# Patient Record
Sex: Female | Born: 1970 | Hispanic: No | Marital: Married | Smoking: Never smoker
Health system: Southern US, Community
[De-identification: ages and names within clinical notes are randomized; demographics above are authoritative.]

## PROBLEM LIST (undated history)

## (undated) HISTORY — PX: WRIST SURGERY: SHX841

---

## 2002-04-29 ENCOUNTER — Other Ambulatory Visit: Admission: RE | Admit: 2002-04-29 | Discharge: 2002-04-29 | Payer: Self-pay | Admitting: *Deleted

## 2002-07-16 ENCOUNTER — Inpatient Hospital Stay (HOSPITAL_COMMUNITY): Admission: AD | Admit: 2002-07-16 | Discharge: 2002-07-18 | Payer: Self-pay | Admitting: *Deleted

## 2008-06-10 ENCOUNTER — Ambulatory Visit: Payer: Self-pay | Admitting: Nurse Practitioner

## 2008-06-10 DIAGNOSIS — H0260 Xanthelasma of unspecified eye, unspecified eyelid: Secondary | ICD-10-CM | POA: Insufficient documentation

## 2008-06-11 ENCOUNTER — Ambulatory Visit: Payer: Self-pay | Admitting: Nurse Practitioner

## 2008-06-11 LAB — CONVERTED CEMR LAB
Albumin: 4.2 g/dL (ref 3.5–5.2)
BUN: 9 mg/dL (ref 6–23)
CO2: 21 meq/L (ref 19–32)
Calcium: 8.5 mg/dL (ref 8.4–10.5)
Chloride: 104 meq/L (ref 96–112)
Cholesterol: 207 mg/dL — ABNORMAL HIGH (ref 0–200)
Eosinophils Absolute: 0.1 10*3/uL (ref 0.0–0.7)
Glucose, Bld: 81 mg/dL (ref 70–99)
HDL: 56 mg/dL (ref >39)
Lymphocytes Relative: 30 % (ref 12–46)
Lymphs Abs: 2.1 10*3/uL (ref 0.7–4.0)
MCV: 85.7 fL (ref 78.0–100.0)
Monocytes Relative: 6 % (ref 3–12)
Neutro Abs: 4.4 10*3/uL (ref 1.7–7.7)
Neutrophils Relative %: 63 % (ref 43–77)
Potassium: 3.6 meq/L (ref 3.5–5.3)
RBC: 4.84 M/uL (ref 3.87–5.11)
Triglycerides: 117 mg/dL (ref <150)

## 2008-08-05 ENCOUNTER — Ambulatory Visit: Payer: Self-pay | Admitting: Nurse Practitioner

## 2008-08-05 ENCOUNTER — Other Ambulatory Visit: Admission: RE | Admit: 2008-08-05 | Discharge: 2008-08-05 | Payer: Self-pay | Admitting: Internal Medicine

## 2008-08-05 LAB — CONVERTED CEMR LAB
Blood in Urine, dipstick: NEGATIVE
GC Probe Amp, Genital: NEGATIVE
Glucose, Urine, Semiquant: NEGATIVE
Ketones, urine, test strip: NEGATIVE
OCCULT 1: NEGATIVE

## 2008-09-16 ENCOUNTER — Ambulatory Visit: Payer: Self-pay | Admitting: Nurse Practitioner

## 2008-09-20 ENCOUNTER — Encounter (INDEPENDENT_AMBULATORY_CARE_PROVIDER_SITE_OTHER): Payer: Self-pay | Admitting: Nurse Practitioner

## 2009-06-22 ENCOUNTER — Ambulatory Visit: Payer: Self-pay | Admitting: Family Medicine

## 2009-06-22 DIAGNOSIS — N390 Urinary tract infection, site not specified: Secondary | ICD-10-CM | POA: Insufficient documentation

## 2009-06-22 LAB — CONVERTED CEMR LAB
Blood in Urine, dipstick: NEGATIVE
Nitrite: NEGATIVE
Specific Gravity, Urine: <1.005
Urobilinogen, UA: 0.2
WBC Urine, dipstick: NEGATIVE

## 2009-06-28 ENCOUNTER — Ambulatory Visit: Payer: Self-pay | Admitting: *Deleted

## 2010-12-31 LAB — CONVERTED CEMR LAB
AST: 14 units/L (ref 0–37)
Albumin: 4.2 g/dL (ref 3.5–5.2)
Alkaline Phosphatase: 56 units/L (ref 39–117)
HDL: 58 mg/dL (ref >39)
Indirect Bilirubin: 0.3 mg/dL (ref 0.0–0.9)
Total Bilirubin: 0.4 mg/dL (ref 0.3–1.2)
Total Protein: 6.7 g/dL (ref 6.0–8.3)
Triglycerides: 108 mg/dL (ref <150)

## 2011-05-18 ENCOUNTER — Emergency Department (HOSPITAL_COMMUNITY)
Admission: EM | Admit: 2011-05-18 | Discharge: 2011-05-18 | Disposition: A | Discharge status: Home / Self Care | Payer: Self-pay | Attending: Emergency Medicine | Admitting: Emergency Medicine

## 2011-05-18 ENCOUNTER — Emergency Department (HOSPITAL_COMMUNITY): Payer: Self-pay

## 2011-05-18 DIAGNOSIS — S93409A Sprain of unspecified ligament of unspecified ankle, initial encounter: Secondary | ICD-10-CM | POA: Insufficient documentation

## 2011-05-18 DIAGNOSIS — M25559 Pain in unspecified hip: Secondary | ICD-10-CM | POA: Insufficient documentation

## 2011-05-18 DIAGNOSIS — M25579 Pain in unspecified ankle and joints of unspecified foot: Secondary | ICD-10-CM | POA: Insufficient documentation

## 2011-05-18 DIAGNOSIS — IMO0002 Reserved for concepts with insufficient information to code with codable children: Secondary | ICD-10-CM | POA: Insufficient documentation

## 2018-05-21 ENCOUNTER — Emergency Department (HOSPITAL_COMMUNITY)
Admission: EM | Admit: 2018-05-21 | Discharge: 2018-05-21 | Disposition: A | Discharge status: Home / Self Care | Attending: Emergency Medicine | Admitting: Emergency Medicine

## 2018-05-21 ENCOUNTER — Encounter (HOSPITAL_COMMUNITY): Payer: Self-pay | Admitting: *Deleted

## 2018-05-21 ENCOUNTER — Other Ambulatory Visit: Payer: Self-pay

## 2018-05-21 ENCOUNTER — Emergency Department (HOSPITAL_COMMUNITY)

## 2018-05-21 DIAGNOSIS — X58XXXA Exposure to other specified factors, initial encounter: Secondary | ICD-10-CM | POA: Diagnosis not present

## 2018-05-21 DIAGNOSIS — Y939 Activity, unspecified: Secondary | ICD-10-CM | POA: Diagnosis not present

## 2018-05-21 DIAGNOSIS — Y929 Unspecified place or not applicable: Secondary | ICD-10-CM | POA: Diagnosis not present

## 2018-05-21 DIAGNOSIS — S52531A Colles' fracture of right radius, initial encounter for closed fracture: Secondary | ICD-10-CM | POA: Diagnosis present

## 2018-05-21 DIAGNOSIS — Y990 Civilian activity done for income or pay: Secondary | ICD-10-CM | POA: Insufficient documentation

## 2018-05-21 MED ORDER — HYDROCODONE-ACETAMINOPHEN 5-325 MG PO TABS
1.0000 | ORAL_TABLET | Freq: Once | ORAL | Status: AC
  Administered 2018-05-21: 1 via ORAL
  Filled 2018-05-21: qty 1

## 2018-05-21 MED ORDER — HYDROCODONE-ACETAMINOPHEN 5-325 MG PO TABS: 1.0000 | ORAL_TABLET | Freq: Four times a day (QID) | ORAL | 0 refills | Status: DC | PRN

## 2018-05-21 MED ORDER — IBUPROFEN 400 MG PO TABS
400.0000 mg | ORAL_TABLET | Freq: Once | ORAL | Status: AC
  Administered 2018-05-21: 400 mg via ORAL
  Filled 2018-05-21: qty 1

## 2018-05-21 MED ORDER — IBUPROFEN 400 MG PO TABS: 400.0000 mg | ORAL_TABLET | Freq: Three times a day (TID) | ORAL | 0 refills | Status: DC | PRN

## 2018-05-21 NOTE — ED Triage Notes (Addendum)
To ED for eval of right forearm pain and swelling since slipping and falling at work. Minimal movement of fingers to right arm due to pain. Right radial pulses palpable. Pt holding arm in place of comfort. Ice pack given

## 2018-05-21 NOTE — ED Provider Notes (Signed)
[facility] Provider Note   CSN: [id number] Arrival date & time: 05/21/18  0012     History   Chief Complaint Chief Complaint  Patient presents with  . Arm Injury  . workers comp    HPI Cassandra Barton is a 47 y.o. female.  The history is provided by the patient and a relative. A language interpreter was used [phone]).  Arm Injury   This is a new problem. Episode onset: prior to arrival at work. The problem occurs constantly. The problem has been gradually worsening. The pain is present in the right wrist. The quality of the pain is described as aching. The pain is moderate. Associated symptoms include limited range of motion. Pertinent negatives include no numbness. She has tried rest for the symptoms. The treatment provided no relief. There has been a history of trauma.     Pt reports fall at work.  She reports coming off a ladder, slipped and fell landing on right wrist She has pain in right wrist only No other complaints Denies head injury   Patient Active Problem List   Diagnosis Date Noted  . UTI 06/22/2009  . 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 No pertinent surgical history.   OB History   None      Home Medications    Prior to Admission medications   Not on File    Family History No family history on file.  Social History Social History   Tobacco Use  . Smoking status: Not on file  Substance Use Topics  . Alcohol use: Not on file  . Drug use: Not on file     Allergies   Patient has no known allergies.   Review of Systems Review of Systems  Musculoskeletal: Positive for arthralgias and joint swelling. Negative for neck pain.  Neurological: Negative for numbness and headaches.  All other systems reviewed and are negative.    Physical Exam Updated Vital Signs BP (!) 147/98 (BP Location: Left Arm)   Pulse 83   Temp 99.2 F (37.3 C) (Oral)   Resp 14   SpO2 98%    Physical Exam CONSTITUTIONAL: Well developed/well nourished HEAD: Normocephalic/atraumatic EYES: EOMI ENMT: Mucous membranes moist NECK: supple no meningeal signs SPINE/BACK:entire spine nontender CV: S1/S2 noted, no murmurs/rubs/gallops noted LUNGS: Lungs are clear to auscultation bilaterally, no apparent distress ABDOMEN: soft NEURO: Pt is awake/alert/appropriate, moves all extremitiesx4.  No facial droop.  Able to move all fingers on right hand without difficulty EXTREMITIES: pulses normal/equal, full ROM, right distal radial pulses intact.  Mild soft tissue swelling to right wrist.  Tenderness to right wrist Full range of motion of right shoulder and right elbow without difficulty. SKIN: warm, color normal PSYCH: no abnormalities of mood noted, alert and oriented to situation   ED Treatments / Results  Labs (all labs ordered are listed, but only abnormal results are displayed) Labs Reviewed - No data to display  EKG None  Radiology Dg Forearm Right  Result Date: 05/21/2018 CLINICAL DATA:  Fall. EXAM: RIGHT FOREARM - 2 VIEW; RIGHT WRIST - COMPLETE 3+ VIEW COMPARISON:  None. FINDINGS: Two views of the right forearm and 4 views of the right wrist are obtained. There is a mostly transverse fracture of the distal right radial metaphysis with fracture lines extending to the radiocarpal and radioulnar joint. There is impaction and dorsal angulation of the distal fracture fragments. No ulnar styloid process fracture. Soft tissue swelling over the wrist and  distal forearm. Carpal bones appear intact. Proximal radius and ulna appear intact. No additional fractures identified. No focal bone lesion or bone destruction. IMPRESSION: Mostly transverse fractures of the distal right radial metaphysis with extension to the radiocarpal and radioulnar joint. No or styloid process fracture. Electronically Signed   By: Burman Nieves M.D.   On: 05/21/2018 01:21   Dg Wrist Complete Right  Result  Date: 05/21/2018 CLINICAL DATA:  Fall. EXAM: RIGHT FOREARM - 2 VIEW; RIGHT WRIST - COMPLETE 3+ VIEW COMPARISON:  None. FINDINGS: Two views of the right forearm and 4 views of the right wrist are obtained. There is a mostly transverse fracture of the distal right radial metaphysis with fracture lines extending to the radiocarpal and radioulnar joint. There is impaction and dorsal angulation of the distal fracture fragments. No ulnar styloid process fracture. Soft tissue swelling over the wrist and distal forearm. Carpal bones appear intact. Proximal radius and ulna appear intact. No additional fractures identified. No focal bone lesion or bone destruction. IMPRESSION: Mostly transverse fractures of the distal right radial metaphysis with extension to the radiocarpal and radioulnar joint. No or styloid process fracture. Electronically Signed   By: Burman Nieves M.D.   On: 05/21/2018 01:21    Procedures Procedures   SPLINT APPLICATION Date/Time: 3:11 AM Authorized by: Joya Gaskins Consent: Verbal consent obtained. Risks and benefits: risks, benefits and alternatives were discussed Consent given by: patient Splint applied by: orthopedic technician Location details: right arm Splint type: sugartong Supplies used: ortho glass Post-procedure: The splinted body part was neurovascularly unchanged following the procedure. Patient tolerance: Patient tolerated the procedure well with no immediate complications.     Medications Ordered in ED Medications  HYDROcodone-acetaminophen (NORCO/VICODIN) 5-325 MG per tablet 1 tablet (1 tablet Oral Given 05/21/18 0319)  ibuprofen (ADVIL,MOTRIN) tablet 400 mg (400 mg Oral Given 05/21/18 0319)     Initial Impression / Assessment and Plan / ED Course  I have reviewed the triage vital signs and the nursing notes.  Pertinent  imaging results that were available during my care of the patient were reviewed by me and considered in my medical decision making  (see chart for details). Narcotic database reviewed and considered in decision making    Patient with accidental fall at work.  She sustained a distal right radius fracture.  This is a closed injury, mild displacement, but does not require closed reduction.  She tolerated splint placement well.  I have instructed her to consult/call hand surgery later this morning.  I discussed appropriate care of splint, we discussed use of pain medications.  Daughter at bedside to assist patient.  Final Clinical Impressions(s) / ED Diagnoses   Final diagnoses:  Closed Colles' fracture of right radius, initial encounter    ED Discharge Orders        Ordered    ibuprofen (ADVIL,MOTRIN) 400 MG tablet  Every 8 hours PRN     05/21/18 0417    HYDROcodone-acetaminophen (NORCO/VICODIN) 5-325 MG tablet  Every 6 hours PRN     05/21/18 0417       Zadie Rhine, MD 05/21/18 [phone]

## 2018-08-14 ENCOUNTER — Ambulatory Visit: Payer: Self-pay | Admitting: Family Medicine

## 2019-09-24 ENCOUNTER — Other Ambulatory Visit: Payer: Self-pay

## 2019-09-24 DIAGNOSIS — Z20822 Contact with and (suspected) exposure to covid-19: Secondary | ICD-10-CM

## 2019-09-26 LAB — NOVEL CORONAVIRUS, NAA: SARS-CoV-2, NAA: DETECTED — AB

## 2019-10-01 ENCOUNTER — Telehealth: Payer: Self-pay | Admitting: *Deleted

## 2019-10-01 NOTE — Telephone Encounter (Signed)
See Results note for today regarding TE for positive Covid19 results.

## 2019-10-09 ENCOUNTER — Other Ambulatory Visit: Payer: Self-pay

## 2019-10-09 DIAGNOSIS — Z20822 Contact with and (suspected) exposure to covid-19: Secondary | ICD-10-CM

## 2019-10-10 LAB — NOVEL CORONAVIRUS, NAA: SARS-CoV-2, NAA: DETECTED — AB

## 2020-08-01 ENCOUNTER — Other Ambulatory Visit: Payer: Self-pay | Admitting: Obstetrics and Gynecology

## 2020-08-01 ENCOUNTER — Other Ambulatory Visit: Payer: Self-pay | Admitting: Family Medicine

## 2020-08-01 DIAGNOSIS — Z1231 Encounter for screening mammogram for malignant neoplasm of breast: Secondary | ICD-10-CM

## 2020-09-06 ENCOUNTER — Ambulatory Visit: Payer: Self-pay

## 2020-09-06 ENCOUNTER — Other Ambulatory Visit: Payer: Self-pay

## 2020-10-11 ENCOUNTER — Ambulatory Visit
Admission: RE | Admit: 2020-10-11 | Discharge: 2020-10-11 | Disposition: A | Discharge status: Home / Self Care | Payer: Self-pay | Source: Ambulatory Visit | Attending: Family Medicine | Admitting: Family Medicine

## 2020-10-11 ENCOUNTER — Ambulatory Visit: Payer: Self-pay | Admitting: *Deleted

## 2020-10-11 ENCOUNTER — Other Ambulatory Visit: Payer: Self-pay

## 2020-10-11 VITALS — BP 140/86 | Temp 97.5°F | Wt 140.5 lb

## 2020-10-11 DIAGNOSIS — Z1231 Encounter for screening mammogram for malignant neoplasm of breast: Secondary | ICD-10-CM

## 2020-10-11 DIAGNOSIS — Z01419 Encounter for gynecological examination (general) (routine) without abnormal findings: Secondary | ICD-10-CM

## 2020-10-11 NOTE — Addendum Note (Signed)
Addended by: Narda Rutherford on: 10/11/2020 10:05 AM   Modules accepted: Orders

## 2020-10-11 NOTE — Patient Instructions (Signed)
Explained breast self awareness with Cassandra Barton. Pap smear completed today. Let her know BCCCP will cover Pap smears and HPV typing every 5 years unless has a history of abnormal Pap smears. Referred patient to the [facility] for a screening mammogram. Appointment scheduled Tuesday, October 11, 2020 at 1030. Patient aware of appointment and will be there. Let patient know will follow up with her within the next couple weeks with results of Pap smear by letter or phone. Informed patient that the Breast Center will follow-up with her within the next couple of weeks with results of mammogram by letter or phone. Cassandra Barton verbalized understanding.  Cassandra Barton, Kathaleen Maser, RN 9:00 AM

## 2020-10-11 NOTE — Progress Notes (Signed)
Cassandra Barton is a 49 y.o. No obstetric history on file. female who presents to [facility] clinic today with no complaints.    Pap Smear: Pap smear completed today. Last Pap smear was over 5 years ago and was normal per patient. Per patient has history of an abnormal Pap smear around 20 years ago that a repeat Pap smear was completed for follow-up. Per patient her follow-up Pap smear was normal and all Pap smears have been normal since. Patient stated she has had at least three normal Pap smears since abnormal. Last Pap smear result is not available in Epic.   Physical exam: Breasts Breasts symmetrical. No skin abnormalities bilateral breasts. No nipple retraction bilateral breasts. No nipple discharge bilateral breasts. No lymphadenopathy. No lumps palpated bilateral breasts. No complaints of pain or tenderness on exam.       Pelvic/Bimanual Ext Genitalia No lesions, no swelling and no discharge observed on external genitalia.        Vagina Vagina pink and normal texture. No lesions or discharge observed in vagina.        Cervix Cervix is present. Cervix pink and of normal texture. Cervix friable. No discharge observed.    Uterus Uterus is present and palpable. Uterus in normal position and normal size.        Adnexae Bilateral ovaries present and palpable. No tenderness on palpation.         Rectovaginal No rectal exam completed today since patient had no rectal complaints. No skin abnormalities observed on exam.     Smoking History: Patient has never smoked.   Patient Navigation: Patient education provided. Access to services provided for patient through BCCCP program. Daughter Cassandra Barton signed required paperwork to interpret for patient.  Colorectal Cancer Screening: Per patient has never had colonoscopy completed. FIT Test given to patient to complete. No complaints today.    Breast and Cervical Cancer Risk Assessment: Patient does not have family history of  breast cancer, known genetic mutations, or radiation treatment to the chest before age 58. Patient does not have history of cervical dysplasia, immunocompromised, or DES exposure in-utero.  Risk Assessment    Risk Scores      10/11/2020   Last edited by: Narda Rutherford, LPN   5-year risk: 0.4 %   Lifetime risk: 4.3 %          A: BCCCP exam with pap smear No complaints.  P: Referred patient to the [facility] for a screening mammogram. Appointment scheduled Tuesday, October 11, 2020 at 1030.  Priscille Heidelberg, RN 10/11/2020 9:00 AM

## 2020-10-17 LAB — CYTOLOGY - PAP
Comment: NEGATIVE
Comment: NEGATIVE
HPV 16: NEGATIVE
HPV 18 / 45: NEGATIVE
High risk HPV: POSITIVE — AB

## 2020-10-21 ENCOUNTER — Telehealth: Payer: Self-pay

## 2020-10-21 NOTE — Telephone Encounter (Signed)
Via Delorise Royals, Spanish Interpreter, Patient informed Pap results LSIL with Positive HPV, per Dr. Jolayne Panther, needs colposcopy, will contact [facility] to schedule colposcopy. Patient verbalized understanding.

## 2020-12-01 ENCOUNTER — Ambulatory Visit (INDEPENDENT_AMBULATORY_CARE_PROVIDER_SITE_OTHER): Payer: Self-pay | Admitting: Family Medicine

## 2020-12-01 ENCOUNTER — Other Ambulatory Visit: Payer: Self-pay

## 2020-12-01 ENCOUNTER — Other Ambulatory Visit (HOSPITAL_COMMUNITY)
Admission: RE | Admit: 2020-12-01 | Discharge: 2020-12-01 | Disposition: A | Discharge status: Home / Self Care | Payer: Self-pay | Source: Ambulatory Visit | Attending: Family Medicine | Admitting: Family Medicine

## 2020-12-01 ENCOUNTER — Encounter: Payer: Self-pay | Admitting: Family Medicine

## 2020-12-01 VITALS — BP 164/87 | HR 64 | Wt 135.4 lb

## 2020-12-01 DIAGNOSIS — R87612 Low grade squamous intraepithelial lesion on cytologic smear of cervix (LGSIL): Secondary | ICD-10-CM

## 2020-12-01 DIAGNOSIS — Z3202 Encounter for pregnancy test, result negative: Secondary | ICD-10-CM

## 2020-12-01 LAB — POCT PREGNANCY, URINE: Preg Test, Ur: NEGATIVE

## 2020-12-01 NOTE — Progress Notes (Signed)
° ° °  GYNECOLOGY CLINIC COLPOSCOPY PROCEDURE NOTE  49 y.o. No obstetric history on file. here for colposcopy for LSIL with positive HRHPV pap smear on 10/11/2020. Discussed role for HPV in cervical dysplasia, need for surveillance.  Patient given informed consent, signed copy in the chart, time out was performed.  Placed in lithotomy position. Cervix viewed with speculum and colposcope after application of acetic acid.   Colposcopy adequate? No. Exceedingly difficult colposcopy due to post-menopausal cervix flush with posterior vaginal wall, great difficulty getting ECC into the os.   acetowhite lesion(s) noted at 9 o'clock; corresponding biopsies obtained.  ECC specimen obtained. All specimens were labeled and sent to pathology.  Patient was given post procedure instructions.  Will follow up pathology and manage accordingly; patient will be contacted with results and recommendations.  Routine preventative health maintenance measures emphasized.  May need repeat procedure pending outcome of pathology.   Venora Maples, MD/[facility]

## 2020-12-05 LAB — SURGICAL PATHOLOGY

## 2020-12-06 ENCOUNTER — Telehealth: Payer: Self-pay | Admitting: Family Medicine

## 2020-12-06 NOTE — Telephone Encounter (Signed)
Attempted to call patient regarding colpo results (CIN-I), reached her daughter who reported she is at work but will be home around 4 PM. Will call back at that time.

## 2020-12-06 NOTE — Telephone Encounter (Signed)
Called patient and confirmed identity, discussed CIN-I results and need for repeat colpo in 1 year. All questions answered.

## 2021-07-09 ENCOUNTER — Ambulatory Visit (INDEPENDENT_AMBULATORY_CARE_PROVIDER_SITE_OTHER)

## 2021-07-09 ENCOUNTER — Ambulatory Visit (HOSPITAL_COMMUNITY)
Admission: EM | Admit: 2021-07-09 | Discharge: 2021-07-09 | Disposition: A | Discharge status: Home / Self Care | Attending: Emergency Medicine | Admitting: Emergency Medicine

## 2021-07-09 ENCOUNTER — Other Ambulatory Visit: Payer: Self-pay

## 2021-07-09 ENCOUNTER — Encounter (HOSPITAL_COMMUNITY): Payer: Self-pay

## 2021-07-09 DIAGNOSIS — S52615A Nondisplaced fracture of left ulna styloid process, initial encounter for closed fracture: Secondary | ICD-10-CM

## 2021-07-09 DIAGNOSIS — M25532 Pain in left wrist: Secondary | ICD-10-CM | POA: Diagnosis not present

## 2021-07-09 DIAGNOSIS — S52572A Other intraarticular fracture of lower end of left radius, initial encounter for closed fracture: Secondary | ICD-10-CM

## 2021-07-09 MED ORDER — HYDROCODONE-ACETAMINOPHEN 5-325 MG PO TABS: 1.0000 | ORAL_TABLET | Freq: Four times a day (QID) | ORAL | 0 refills | Status: AC | PRN

## 2021-07-09 NOTE — ED Provider Notes (Signed)
HPI  SUBJECTIVE:  Cassandra Barton is a right-handed 50 y.o. female who presents with a slip and fall on a wet floor, falling onto an outstretched left hand.  This occurred immediately prior to arrival while at work.  She reports swelling, tingling in her fingers, limitation of motion in her wrist.  No numbness, bruising, limitation motion of her fingers.  She states her forearm and hand are without injury.  She tried an unknown medication with improvement in her symptoms.  Symptoms are worse with palpation, movement.  She has no past medical history.  She has never injured this wrist before.  PMD: None.  Orthopedics: She has seen Dr. Amanda Pea at [facility] for right wrist fracture.  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 No pertinent past medical history.  Past Surgical History:  Procedure Laterality Date   WRIST SURGERY      History reviewed. No pertinent family history.  Social History   Tobacco Use   Smoking status: Never   Smokeless tobacco: Never  Vaping Use   Vaping Use: Never used  Substance Use Topics   Alcohol use: Yes    Comment: occasionally   Drug use: Never    No current facility-administered medications for this encounter.  Current Outpatient Medications:    HYDROcodone-acetaminophen (NORCO/VICODIN) 5-325 MG tablet, Take 1-2 tablets by mouth every 6 (six) hours as needed for moderate pain or severe pain., Disp: 12 tablet, Rfl: 0   Multiple Vitamin (MULTIVITAMIN) tablet, Take 1 tablet by mouth daily., Disp: , Rfl:   No Known Allergies   ROS  As noted in HPI.   Physical Exam   Constitutional: Well developed, well nourished, no acute distress Eyes:  EOMI, conjunctiva normal bilaterally HENT: Normocephalic, atraumatic,mucus membranes moist Respiratory: Normal inspiratory effort Cardiovascular: Normal rate GI: nondistended skin: No rash, skin intact Musculoskeletal: Tender swelling left wrist.  No bruising.  distal radius tender,  distal ulnar styloid ender, snuffbox tender, carpals NT, metacarpals NT , digits NT, TFCC tender,  pain with supination,  pain  with pronation, pain with radial / ulnar deviation. Motor intact ability to flex / extend digits of affected hand, Sensation LT to hand normal.  RP 2+.  Proximal forearm nontender.  Neurologic: Alert & oriented x 3, no focal neuro deficits Psychiatric: Speech and behavior appropriate   ED Course   Medications - No data to display  Orders Placed This Encounter  Procedures   DG Wrist Complete Left    Standing Status:   Standing    Number of Occurrences:   1    Order Specific Question:   Reason for Exam (SYMPTOM  OR DIAGNOSIS REQUIRED)    Answer:   Fall Inury    Order Specific Question:   Release to patient    Answer:   Immediate   Apply Wrist brace    Sugar-tong splint    Standing Status:   Standing    Number of Occurrences:   1    Order Specific Question:   Laterality    Answer:   Left    No results found for this or any previous visit (from the past 24 hour(s)). DG Wrist Complete Left  Addendum Date: 07/09/2021   ADDENDUM REPORT: 07/09/2021 11:23 ADDENDUM: Addendum is made to note presence of a very subtle, nondisplaced fracture of the ulnar styloid, not noted in original dictation. Electronically Signed   By: Lauralyn Primes M.D.   On: 07/09/2021 11:23   Result Date: 07/09/2021 CLINICAL DATA:  Slip  and fall, pain EXAM: LEFT WRIST - COMPLETE 3+ VIEW COMPARISON:  None. FINDINGS: There are minimally displaced, comminuted intra-articular fractures of the distal left radius. The distal left ulna is intact. The carpus is normally aligned. Soft tissue edema about the wrist. IMPRESSION: There are minimally displaced, comminuted intra-articular fractures of the distal left radius. The distal left ulna is intact. Electronically Signed: By: Lauralyn Primes M.D. On: 07/09/2021 11:11    ED Clinical Impression  1. Other closed intra-articular fracture of distal end of left  radius, initial encounter   2. Closed nondisplaced fracture of styloid process of left ulna, initial encounter      ED Assessment/Plan   [place] Narcotic database reviewed for this patient, and feel that the risk/benefit ratio today is favorable for proceeding with a prescription for controlled substance.  No opiate prescriptions in the past 2 years.  Reviewed imaging independently and discussed with radiology.  Small nondisplaced ulnar styloid fracture, nondisplaced distal radius fracture.  See radiology report for full details.  Patient is neurovascularly intact.  Placing in a sugar-tong splint, home with Tylenol and Norco.  Discussed with Dr. Everardo Pacific, orthopedics on-call.  His office will contact patient, sort out the workers compensation and they will see her this week.  He agrees with plan of pain control, sugar-tong splint.  Will provide primary care list for ongoing care and order assistance in finding a PMD  Discussed imaging, MDM, treatment plan, and plan for follow-up with patient. Discussed sn/sx that should prompt return to the ED. patient agrees with plan.   Meds ordered this encounter  Medications   HYDROcodone-acetaminophen (NORCO/VICODIN) 5-325 MG tablet    Sig: Take 1-2 tablets by mouth every 6 (six) hours as needed for moderate pain or severe pain.    Dispense:  12 tablet    Refill:  0      *This clinic note was created using Scientist, clinical (histocompatibility and immunogenetics). Therefore, there may be occasional mistakes despite careful proofreading.  ?    Domenick Gong, MD 07/09/21 1240

## 2021-07-09 NOTE — ED Notes (Signed)
Patients daughter is translating for the patient.

## 2021-07-09 NOTE — Progress Notes (Signed)
Orthopedic Tech Progress Note Patient Details:  Cassandra Barton Nov 05, 1971 [id number]  Ortho Devices Type of Ortho Device: Sugartong splint, Shoulder immobilizer Ortho Device/Splint Location: LUE Ortho Device/Splint Interventions: Ordered, Application, Adjustment   Post Interventions Patient Tolerated: Well Instructions Provided: Adjustment of device, Care of device, Poper ambulation with device  Cassandra Barton 07/09/2021, 1:39 PM

## 2021-07-09 NOTE — Discharge Instructions (Addendum)
Follow-up with Dr. Everardo Pacific ASAP.  Keep the splint on at all times until you are evaluated by hand surgeon.  Take 1000 g Tylenol or 1-2 Norco 3-4 times a day as needed for pain.  Ice, elevate above your heart is much as possible.  Below is a list of primary care practices who are taking new patients for you to follow-up with.  [facility] internal medicine clinic Ground Floor - [facility], [address] [place], [place], [place] [address] [phone]  [facility] at [facility] [address] [place], [place] [address] [phone]  [facility] [address] [place], [place] [address] [phone]  [facility]Internal Medicine [phone] [address] [place] [address]  [facility]: [address] [place] [address]  [phone]  [facility] Family Medicine: [address] [place] [address]  [phone]  [facility] : [address] [place] [address] [phone]  [facility] Primary [facility]: [address] [place] [address] [phone]  [facility]: [address] [place] [address] [phone]  Dr. Oneal Grout [address] [place] [address]  [phone]  Go to www.goodrx.com  or www.costplusdrugs.com to look up your medications. This will give you a list of where you can find your prescriptions at the most affordable prices. Or ask the pharmacist what the cash price is, or if they have any other discount programs available to help make your medication more affordable. This can be less expensive than what you would pay with insurance.

## 2021-07-09 NOTE — ED Triage Notes (Signed)
Pt presents with left wrist injury after a fall at work this morning.

## 2021-11-20 ENCOUNTER — Other Ambulatory Visit: Payer: Self-pay

## 2021-11-20 DIAGNOSIS — Z1231 Encounter for screening mammogram for malignant neoplasm of breast: Secondary | ICD-10-CM

## 2022-01-18 ENCOUNTER — Ambulatory Visit: Payer: Self-pay

## 2022-01-18 DIAGNOSIS — Z1211 Encounter for screening for malignant neoplasm of colon: Secondary | ICD-10-CM

## 2022-02-19 IMAGING — DX DG WRIST COMPLETE 3+V*L*
3 series · 3 of 3 positions shown · non-contrast
Comparison: None.
COMPARISON: None.

Addendum:
CLINICAL DATA: Slip and fall, pain

EXAM:
LEFT WRIST - COMPLETE 3+ VIEW

[wrist pa (1 of 2)]
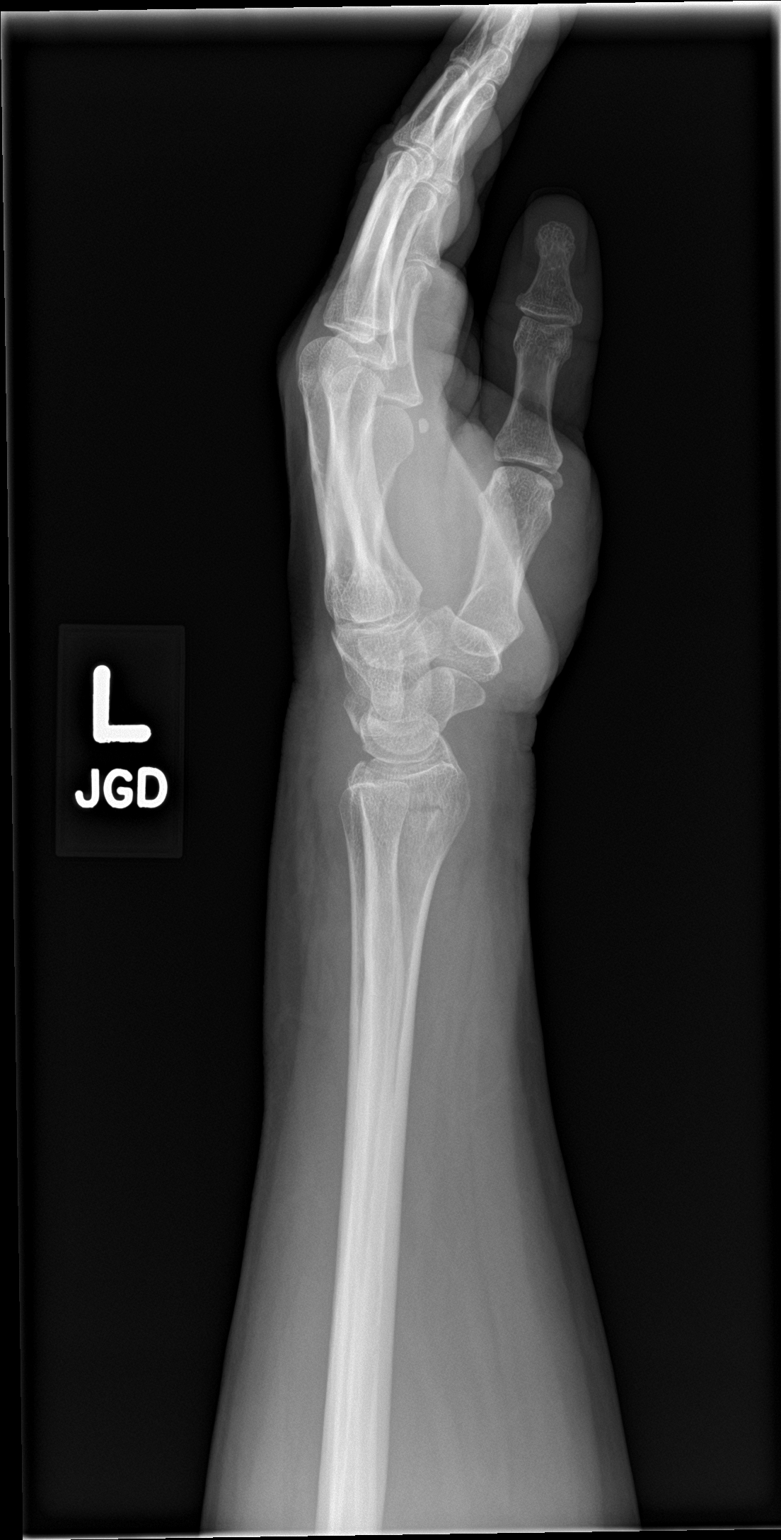

[wrist obl]
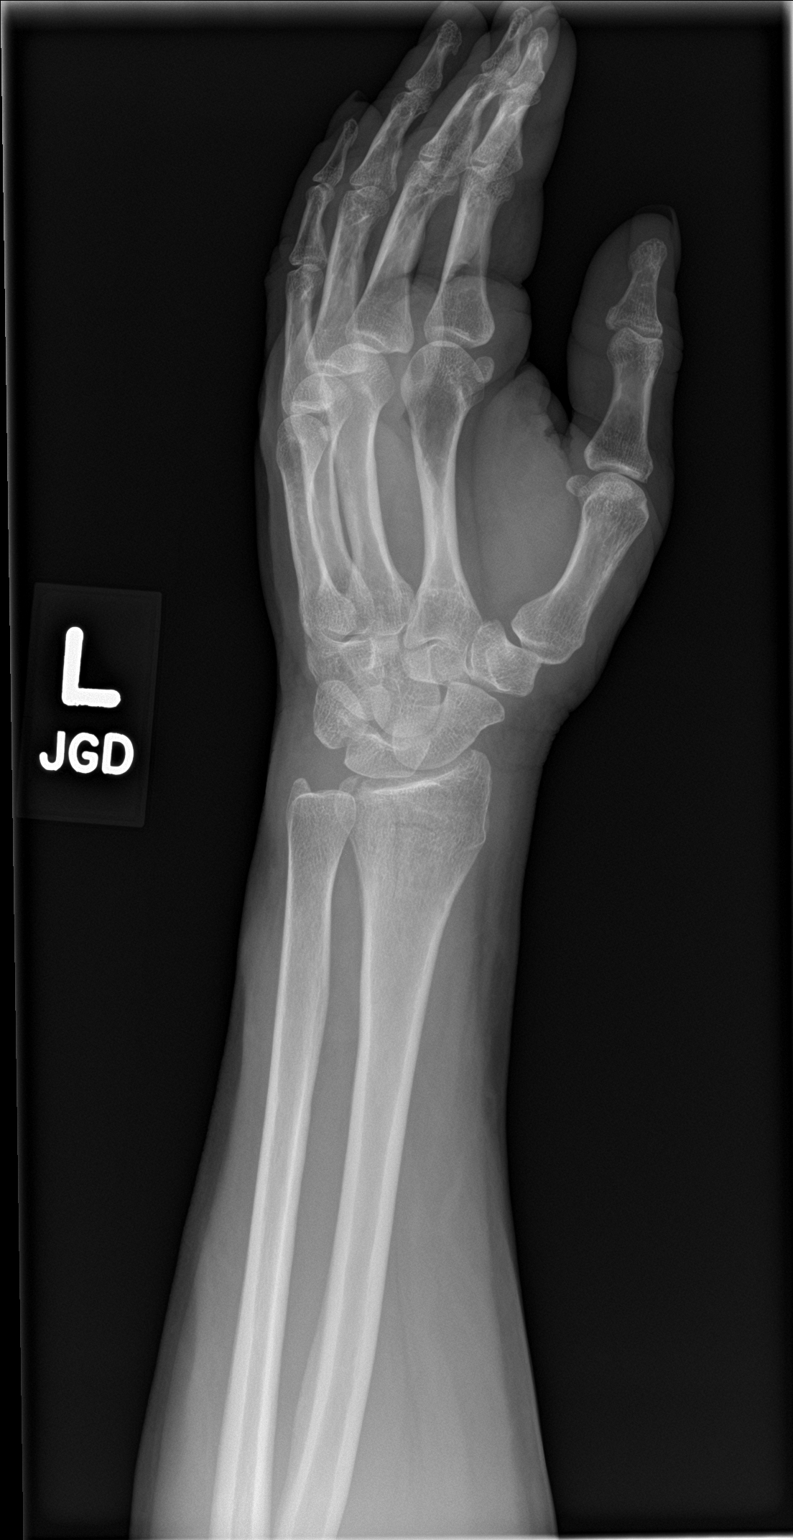

[wrist pa (2 of 2)]
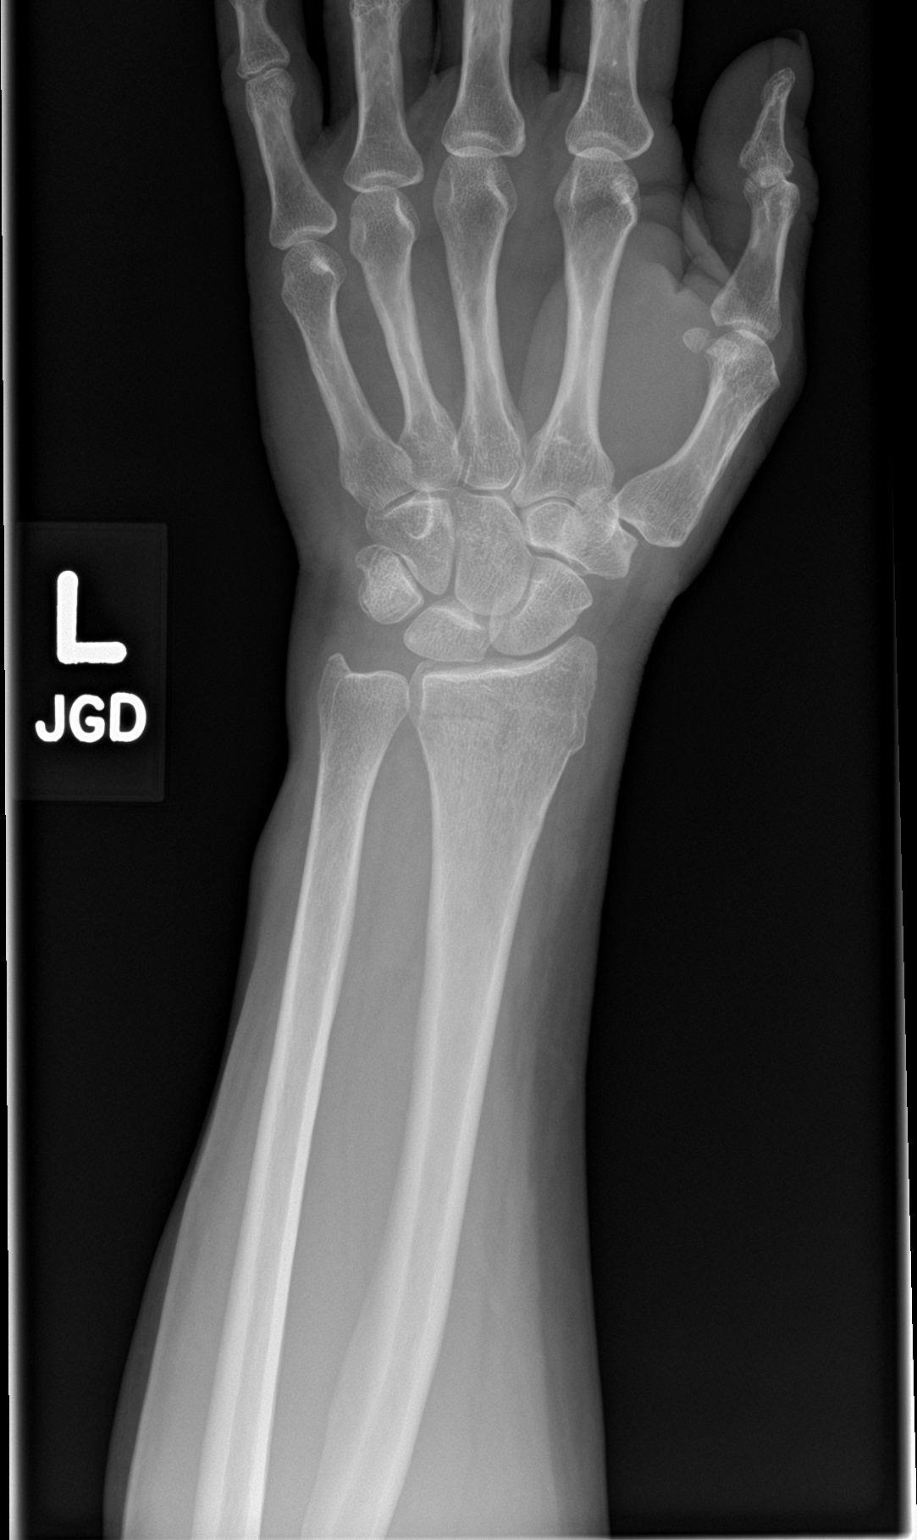

[3 of 3 positions shown; findings below may reference images not displayed]

FINDINGS: There are minimally displaced, comminuted intra-articular fractures
of the distal left radius. The distal left ulna is intact. The
carpus is normally aligned. Soft tissue edema about the wrist.
IMPRESSION: There are minimally displaced, comminuted intra-articular fractures
of the distal left radius. The distal left ulna is intact.

ADDENDUM:
Addendum is made to note presence of a very subtle, nondisplaced
fracture of the ulnar styloid, not noted in original dictation.

*** End of Addendum ***
FINDINGS: There are minimally displaced, comminuted intra-articular fractures
of the distal left radius. The distal left ulna is intact. The
carpus is normally aligned. Soft tissue edema about the wrist.
IMPRESSION: There are minimally displaced, comminuted intra-articular fractures
of the distal left radius. The distal left ulna is intact.

## 2024-06-18 ENCOUNTER — Other Ambulatory Visit: Payer: Self-pay | Admitting: Obstetrics and Gynecology

## 2024-06-18 DIAGNOSIS — Z1231 Encounter for screening mammogram for malignant neoplasm of breast: Secondary | ICD-10-CM

## 2024-08-06 ENCOUNTER — Ambulatory Visit: Payer: Self-pay

## 2025-01-28 ENCOUNTER — Ambulatory Visit: Payer: Self-pay | Admitting: Dermatology
